# Patient Record
Sex: Male | Born: 1980 | Race: White | Hispanic: No | Marital: Married | Smoking: Current every day smoker
Health system: Southern US, Community
[De-identification: ages and names within clinical notes are randomized; demographics above are authoritative.]

---

## 2001-06-09 ENCOUNTER — Inpatient Hospital Stay (HOSPITAL_COMMUNITY): Admission: EM | Admit: 2001-06-09 | Discharge: 2001-06-11 | Payer: Self-pay

## 2001-08-07 ENCOUNTER — Encounter: Payer: Self-pay | Admitting: Neurosurgery

## 2001-08-07 ENCOUNTER — Encounter: Admission: RE | Admit: 2001-08-07 | Discharge: 2001-08-07 | Payer: Self-pay | Admitting: Neurosurgery

## 2002-08-17 ENCOUNTER — Emergency Department (HOSPITAL_COMMUNITY): Admission: EM | Admit: 2002-08-17 | Discharge: 2002-08-17 | Payer: Self-pay | Admitting: Emergency Medicine

## 2003-10-30 ENCOUNTER — Emergency Department: Payer: Self-pay | Admitting: Emergency Medicine

## 2004-06-04 ENCOUNTER — Emergency Department (HOSPITAL_COMMUNITY): Admission: EM | Admit: 2004-06-04 | Discharge: 2004-06-04 | Payer: Self-pay | Admitting: Emergency Medicine

## 2005-08-16 ENCOUNTER — Emergency Department (HOSPITAL_COMMUNITY): Admission: EM | Admit: 2005-08-16 | Discharge: 2005-08-17 | Payer: Self-pay | Admitting: Emergency Medicine

## 2007-02-20 ENCOUNTER — Emergency Department (HOSPITAL_COMMUNITY): Admission: EM | Admit: 2007-02-20 | Discharge: 2007-02-20 | Payer: Self-pay | Admitting: Emergency Medicine

## 2009-12-01 ENCOUNTER — Emergency Department (HOSPITAL_COMMUNITY): Admission: EM | Admit: 2009-12-01 | Discharge: 2009-12-01 | Payer: Self-pay | Admitting: Emergency Medicine

## 2010-06-05 NOTE — Discharge Summary (Signed)
[place]. [facility]  Patient:    Luis Anderson, Luis Anderson Visit Number: [id number] MRN: [id number]          Service Type: TRA Location: 5000 5013 01 Attending Physician:  Trauma, Md Dictated by:   Eugenia Pancoast, P.A. Admit Date:  06/09/2001 Discharge Date: 06/11/2001   CC:         Carolan Shiver, M.D.  Tanya Nones. Jeral Fruit, M.D.   Discharge Summary  DATE OF BIRTH: 01-06-1981.  CONSULTING PHYSICIANS: Tanya Nones. Jeral Fruit, M.D., Carolan Shiver, M.D.  FINAL DIAGNOSES 1. Fall. 2. Head trauma. 3. Temporal bone fracture. 4. Cerebrospinal fluid leak, left ear. 5. Diplopia. 6. Left orbital wall fracture. 7. Left zygomatic arch fracture. 8. Pneumocephalus, left middle cranial fossa. 9. Subdural hematoma, small in the parietal region.  HISTORY: The patient is a 30 year old gentleman who got out of the car on wet concrete on the street and slipped and fell and hit his head. He noted some bleeding and fluid in his left ear. He subsequently came to the emergency room by private vehicle.  The patient was seen in the emergency room by Dr. Lindie Spruce and Dr. Jeral Fruit.  Dr. Jeral Fruit was consulted secondary to the head injury as noted. The patient underwent a CT scan which revealed the left temporal bone fracture and a fracture of the lateral wall of the left sphenoid sinus and the apex of the left orbit. There was also pneumocephalus in the left middle cranial fossa and a 21 mm subdural hematoma in the left parietal region and the zygomatic arch fracture as also noted. The patients CT scan of the cervical spine was negative. The patient was subsequently hospitalized.  HOSPITAL COURSE: The patient was hospitalized and no untoward events occurred during his hospital stay. He did have a complaint of nausea and vomiting earlier on admission. This did resolve rapidly. He continued to progress in a satisfactory manner during his stay. His diet was advanced to a regular diet as  tolerated. He was also seen by Dr. Dorma Russell. Dr. Dorma Russell saw the patient and noted the fractures of the left zygomatic arch, however, the fracture at this time did not need repair. He did note the patient did have diplopia and this will be followed up as an outpatient.  The patient was doing satisfactorily by Jun 11, 2001. At this time he was up and ambulating without difficulty. He was tolerating his diet satisfactorily and with no significant complaints. He does have occasional headache which is understandable at this time. He was subsequently prepared for discharge.  DISCHARGE MEDICATIONS: He was given Darvocet 1 to 2 p.o. q.4-6h. p.r.n. pain. He was told to take Motrin as needed over-the-counter for this also.  FOLLOW UP: He is given Dr. Eino Farber number to call and is to follow up with Dr. Jeral Fruit in one week.  He was also given Dr. Jac Canavan number and was told to to follow up with Dr. Dorma Russell in approximately one week. He was told to come to the trauma clinic on June 20, 2001, which is Tuesday for follow up with [place].  DISPOSITION:  The patient is subsequently discharged to home in satisfactory and in stable condition on Jun 11, 2001. Dictated by:   Eugenia Pancoast, P.A. Attending Physician:  Trauma, Md DD:  06/11/01 TD:  06/13/01 Job: [id number]

## 2010-06-05 NOTE — Consult Note (Signed)
[place]. [facility]  Patient:    Luis Anderson, Luis Anderson Visit Number: [id number] MRN: [id number]          Service Type: TRA Location: 5000 5013 01 Attending Physician:  Trauma, Md Dictated by:   Carolan Shiver, M.D. Proc. Date: 06/10/01 Admit Date:  06/09/2001 Discharge Date: 06/11/2001   CC:         Tanya Nones. Jeral Fruit, M.D.  Jimmye Norman, M.D.   Consultation Report  REASON FOR CONSULTATION:  Left longitudinal temporal bone fracture with CSF otorrhea and multiple skull base fractures.  I was asked to see the patient by the emergency room physician and the trauma team.  HISTORY OF PRESENT ILLNESS:  Luis Anderson is a 30 year old [place] male who allegedly slipped and fell while getting out of his motor vehicle on Jun 09, 2001.  He slipped in his driveway, fell backwards, and struck his head on concrete.  He denies any loss of consciousness.  He had some slight dizziness and currently denies any hearing loss.  He complains of some horizontal diplopia on left lateral gaze and some slight diplopia with straight ahead vision.  He had positive frank CSF otorrhea from his left external auditory canal in the emergency room and today.  CT scan of his head demonstrated left lateral longitudinal temporal bone fracture which also involved his left lateral orbital wall, the left inferior lateral aspect of the left sphenoid sinus, and a nondisplaced fracture of his left zygomatic arch.  He denied any previous history of skull fractures.  PHYSICAL EXAMINATION:  GENERAL:  He was awake, alert, and oriented x e.  VITAL SIGNS:  He was afebrile.  HEENT:  He did not complain of headache.  His main complaint was that of diplopia.  His head was normocephalic with bilateral symmetric facial motions. There are no signs of any facial paresis or paralysis.  He also had no obvious scalp lacerations.  Eyes: Pupils PERRLA.  There are no signs of high ______ or subjunctival  hemorrhage.  He did have diplopia, mostly on the left lateral gaze and slight diplopia on straight ahead gaze.  On right lateral gaze, he demonstrated lateral gaze nystagmus.  There was no nystagmus in any other field of gaze.  He had a tender left zygoma and temporal area.  Ears: External ears and canals were stable.  There was negative Battle signs AU.  He had a left hemotympanum with a tear of left anterior superior quadrant of the tympanic membrane.  There was CSF medial to the left tympanic membrane.  I did not see any frank CSF leaking today through the TM which seems to have sealed.  Right tympanic membrane was clear and mobile.  There was no narrowing of the left external auditory canal.  External and internal nasal exam was negative, no signs of CSF, rhinorrhea.  Oral cavity: Lips and palate normal.  Teeth intact.  Mandible stable.  NECK:  Negative.  DIAGNOSTIC DATA:  Review of CT head scans show 1) left longitudinal temporal bone fracture, 2) fracture of lateral wall sphenoid sinus in the left lateral orbital wall at the apex, 3) nondisplaced fracture of the left zygomatic arch, 4) middle fossa pneumocephalus with small 1 mm subdural hemorrhage in the left parietal area.  This was new on the Jun 10, 2001, CT scan.  IMPRESSION: 1. Left lateral longitudinal temporal bone fractures with left CSF otorrhea. 2. Fractured left lateral orbital wall, lateral sphenoid sinus wall with    diplopia.  3. New middle fossa pneumocephalus and small subdural hemorrhage, left    parietal area. 4. Nondisplaced fracture of the left zygomatic arch.  RECOMMENDATIONS: 1. Observation at this time.  The CSF leak will probably stop spontaneously.    If it does not, he may need to have either a neurosurgical or neurologic    procedure to seal the leak. 2. Evaluation in my office once the patient is discharged for complete    otomicroscopy and complete audiometric testing. 3. Observe for possible  delayed facial paresis or paralysis. 4. Observe for neurologic changes secondary to subdural hemorrhage. 5. Diplopia is probably secondary to the left lateral orbital wall fracture,    possibly the fracture of the left lateral sphenoid sinus.  There is no    evidence of third or sixth nerve paresis or palsy. 6. The left zygomatic arch does not require treatment at this time as it    is nondisplaced. 7. The patient should have high resolution CT scanning of his temporal    bones prior to discharge. 8. The patient should be observed for any delayed tears of his major vessels    such as intimal tear of his internal carotid artery leading to carotid    cavernous fistula.  He may need to be restudied with MRI and MRA within    the next three to six months. Dictated by:   Carolan Shiver, M.D. Attending Physician:  Trauma, Md DD:  06/10/01 TD:  06/13/01 Job: [id number]

## 2010-06-05 NOTE — Consult Note (Signed)
[place]. [facility]  Patient:    Luis Anderson, Luis Anderson Visit Number: [id number] MRN: [id number]          Service Type: TRA Location: 5000 5013 01 Attending Physician:  Trauma, Md Dictated by:   Tanya Nones. Jeral Fruit, M.D. Proc. Date: 06/09/01 Admit Date:  06/09/2001 Discharge Date: 06/11/2001                            Consultation Report  HISTORY:  The patient is a gentleman who today while getting out of his car he fell and hit his head.  He was brought to the emergency room where he was seen by the emergency room physician.  CT scan of the head __________ was done and we were called for evaluation.  According to the patient, he did not lose consciousness.  He noticed some bloody drainage coming from the left ear as he had been trying to clean the ear with his fingers.  At the present time mentally he is alert and oriented x3.  The pupils are equal and reactive 20 mm and funduscopy is clear.  He has full ocular _________ .  There is a swelling in the left zygoma with quite a bit of tenderness in that area.  There is some redness of the ear and there is obvious bloody fluid which probably is some CSF with blood coming from the left ear.  There is quite a bit of tenderness in the mastoid area.  The hearing seems to be decreased on the left side and no longer on the right side.  Strength is normal in the upper and lower extremities.  Flexion normal.  The CT scan of the head showed that he has a fracture of the temporal bone which goes into the temporomandibular joint. There is also some air in the middle fossa.  There is fracture of the zygoma and fracture of the lateral wall of the orbit.  The patient fractured the base of the skull with a facial fracture.  RECOMMENDATIONS:  The patient is going to be seen by either by the trauma team or by the facial surgeon on call.  From the neurosurgical point of view, there is nothing else to do except observation.  I  basically was advised to avoid cleaning his ear.  We are going to put a loose 4 x 4 to cover the area.  The patient needs to be admitted for observation for at least 24-48 hours until he has stopped draining completely from the left ear.  Any other recommendation will be up to the trauma or maxillofacial surgeon. Dictated by:   Tanya Nones. Jeral Fruit, M.D. Attending Physician:  Trauma, Md DD:  06/09/01 TD:  06/13/01 Job: [id number]

## 2014-10-09 ENCOUNTER — Emergency Department (HOSPITAL_COMMUNITY)
Admission: EM | Admit: 2014-10-09 | Discharge: 2014-10-09 | Disposition: A | Discharge status: Home / Self Care | Payer: Self-pay | Attending: Emergency Medicine | Admitting: Emergency Medicine

## 2014-10-09 ENCOUNTER — Encounter (HOSPITAL_COMMUNITY): Payer: Self-pay | Admitting: Family Medicine

## 2014-10-09 ENCOUNTER — Emergency Department (HOSPITAL_COMMUNITY): Payer: Self-pay

## 2014-10-09 DIAGNOSIS — R140 Abdominal distension (gaseous): Secondary | ICD-10-CM | POA: Insufficient documentation

## 2014-10-09 DIAGNOSIS — K5900 Constipation, unspecified: Secondary | ICD-10-CM | POA: Insufficient documentation

## 2014-10-09 DIAGNOSIS — Z720 Tobacco use: Secondary | ICD-10-CM | POA: Insufficient documentation

## 2014-10-09 DIAGNOSIS — R200 Anesthesia of skin: Secondary | ICD-10-CM | POA: Insufficient documentation

## 2014-10-09 DIAGNOSIS — R0789 Other chest pain: Secondary | ICD-10-CM | POA: Insufficient documentation

## 2014-10-09 DIAGNOSIS — F419 Anxiety disorder, unspecified: Secondary | ICD-10-CM | POA: Insufficient documentation

## 2014-10-09 DIAGNOSIS — R202 Paresthesia of skin: Secondary | ICD-10-CM | POA: Insufficient documentation

## 2014-10-09 DIAGNOSIS — R0602 Shortness of breath: Secondary | ICD-10-CM | POA: Insufficient documentation

## 2014-10-09 LAB — BASIC METABOLIC PANEL
Anion gap: 10 (ref 5–15)
BUN: 10 mg/dL (ref 6–20)
CALCIUM: 9.6 mg/dL (ref 8.9–10.3)
CO2: 26 mmol/L (ref 22–32)
Chloride: 101 mmol/L (ref 101–111)
Creatinine, Ser: 0.99 mg/dL (ref 0.61–1.24)
GFR calc Af Amer: >60 mL/min (ref >60)
GLUCOSE: 121 mg/dL — AB (ref 65–99)
POTASSIUM: 3.6 mmol/L (ref 3.5–5.1)
Sodium: 137 mmol/L (ref 135–145)

## 2014-10-09 LAB — CBC
HEMATOCRIT: 46.6 % (ref 39.0–52.0)
Hemoglobin: 16 g/dL (ref 13.0–17.0)
MCH: 29.5 pg (ref 26.0–34.0)
MCHC: 34.3 g/dL (ref 30.0–36.0)
MCV: 85.8 fL (ref 78.0–100.0)
Platelets: 181 10*3/uL (ref 150–400)
RBC: 5.43 MIL/uL (ref 4.22–5.81)
RDW: 13.6 % (ref 11.5–15.5)
WBC: 6.4 10*3/uL (ref 4.0–10.5)

## 2014-10-09 LAB — I-STAT TROPONIN, ED: Troponin i, poc: 0 ng/mL (ref 0.00–0.08)

## 2014-10-09 NOTE — Discharge Instructions (Signed)
See primary care doctor for appointment. Consider stress test since you have persistent symptoms.   Return to ER if you have worse chest pain, shortness of breath, trouble breathing, worse numbness.

## 2014-10-09 NOTE — ED Notes (Signed)
Pt sts increasing SOB over the past month, sts worsening over the past 2 weeks and stomach bloating, hands feel cold, chest pain and numbness in face. sts CHF runs in his family. St she took his BP at home and was hypertensive at home.

## 2014-10-09 NOTE — ED Notes (Signed)
MD at bedside. 

## 2014-10-09 NOTE — ED Provider Notes (Signed)
CSN: [id number]     Arrival date & time 10/09/14  1304 History   First MD Initiated Contact with Patient 10/09/14 1543     Chief Complaint  Patient presents with  . Shortness of Breath  . Dizziness  . Chest Pain     (Consider location/radiation/quality/duration/timing/severity/associated sxs/prior Treatment) The history is provided by the patient.  Luis Anderson is a 34 y.o. male who presented with shortness of breath, chest pain, numbness and tingling. Patient has been having shortness of breath over the last month as well as intermittent chest pain. Went to [facility] 2 weeks ago and had negative troponin, normal labs, negative d-dimer. He does not have any primary care doctor. He was at work today and then had another episode of shortness of breath and some chest pain as well as his hands feel cold. Denies any weakness. States that he has been having some constipation and increased gas and abdominal bloating. Denies any fever or vomiting. He is otherwise healthy and no history of CAD. Father has CHF. He smokes occasionally.    History reviewed. No pertinent past medical history. History reviewed. No pertinent past surgical history. History reviewed. No pertinent family history. Social History  Substance Use Topics  . Smoking status: Current Every Day Smoker    Types: Cigarettes  . Smokeless tobacco: None  . Alcohol Use: No    Review of Systems  Respiratory: Positive for shortness of breath.   Cardiovascular: Positive for chest pain.  All other systems reviewed and are negative.     Allergies  Review of patient's allergies indicates no known allergies.  Home Medications   Prior to Admission medications   Not on File   BP 146/80 mmHg  Pulse 67  Temp(Src) 97.8 F (36.6 C)  Resp 18  SpO2 100% Physical Exam  Constitutional: He is oriented to person, place, and time.  Anxious   HENT:  Head: Normocephalic.  Mouth/Throat: Oropharynx is clear and moist.   Eyes: Conjunctivae are normal. Pupils are equal, round, and reactive to light.  Neck: Normal range of motion. Neck supple.  Cardiovascular: Normal rate, regular rhythm and normal heart sounds.   Pulmonary/Chest: Effort normal and breath sounds normal. No respiratory distress. He has no wheezes. He has no rales.  Abdominal: Soft. Bowel sounds are normal. He exhibits no distension. There is no tenderness. There is no rebound and no guarding.  Musculoskeletal: Normal range of motion. He exhibits no edema or tenderness.  Neurological: He is alert and oriented to person, place, and time. No cranial nerve deficit. Coordination normal.  Skin: Skin is warm and dry.  Psychiatric: He has a normal mood and affect. His behavior is normal. Judgment and thought content normal.  Nursing note and vitals reviewed.   ED Course  Procedures (including critical care time) Labs Review Labs Reviewed  BASIC METABOLIC PANEL - Abnormal; Notable for the following:    Glucose, Bld 121 (*)    All other components within normal limits  CBC  I-STAT TROPOININ, ED    Imaging Review Dg Chest 2 View  10/09/2014   CLINICAL DATA:  Increasing shortness of breath over the last month abdominal bloating. Initial encounter.  EXAM: CHEST  2 VIEW  COMPARISON:  09/14/2014 radiographs.  FINDINGS: The heart size and mediastinal contours are normal. The lungs are clear. There is no pleural effusion or pneumothorax. No acute osseous findings are identified.  IMPRESSION: Stable chest.  No active cardiopulmonary process.   Electronically Signed  By: Carey Bullocks M.D.   On: 10/09/2014 13:42   I have personally reviewed and evaluated these images and lab results as part of my medical decision-making.   EKG Interpretation   Date/Time:  Wednesday October 09 2014 13:07:08 EDT Ventricular Rate:  67 PR Interval:  136 QRS Duration: 90 QT Interval:  374 QTC Calculation: 395 R Axis:   82 Text Interpretation:  Normal sinus rhythm  Normal ECG No previous ECGs  available Confirmed by YAO  MD, DAVID ([id number]) on 10/09/2014 3:47:07 PM      MDM   Final diagnoses:  None    Luis Anderson is a 34 y.o. male here with shortness of breath, chest pain. Symptoms over the last month, worse today. Appears anxious. No previous stress test. D-dimer neg 2 weeks ago at [facility]. I think likely more anxiety related. [facility].   4:33 PM Labs unremarkable. CXR nl. Able to get him outpatient f/u. Will dc home. Pain free now.     Richardean Canal, MD 10/09/14 [phone]

## 2014-10-17 ENCOUNTER — Encounter: Payer: Self-pay | Admitting: Family Medicine

## 2014-10-17 ENCOUNTER — Ambulatory Visit (INDEPENDENT_AMBULATORY_CARE_PROVIDER_SITE_OTHER): Payer: Self-pay | Admitting: Family Medicine

## 2014-10-17 VITALS — BP 124/74 | HR 62 | Temp 98.1°F | Resp 16 | Ht 71.0 in | Wt 190.0 lb

## 2014-10-17 DIAGNOSIS — Z7189 Other specified counseling: Secondary | ICD-10-CM

## 2014-10-17 DIAGNOSIS — K047 Periapical abscess without sinus: Secondary | ICD-10-CM

## 2014-10-17 DIAGNOSIS — Z23 Encounter for immunization: Secondary | ICD-10-CM

## 2014-10-17 DIAGNOSIS — Z7689 Persons encountering health services in other specified circumstances: Secondary | ICD-10-CM

## 2014-10-17 DIAGNOSIS — F411 Generalized anxiety disorder: Secondary | ICD-10-CM

## 2014-10-17 MED ORDER — ESOMEPRAZOLE MAGNESIUM 40 MG PO CPDR: 40.0000 mg | DELAYED_RELEASE_CAPSULE | Freq: Every day | ORAL | Status: AC

## 2014-10-17 MED ORDER — OMEPRAZOLE 40 MG PO CPDR: 40.0000 mg | DELAYED_RELEASE_CAPSULE | Freq: Every day | ORAL | Status: AC

## 2014-10-17 MED ORDER — PENICILLIN V POTASSIUM 250 MG PO TABS: 250.0000 mg | ORAL_TABLET | Freq: Four times a day (QID) | ORAL | Status: DC

## 2014-10-17 NOTE — Patient Instructions (Signed)
Take penicillin 4 times a day, every 6 hours. You need to see a dentist as soon as feasible. Take Prilosec once a day. You are getting a Tetanus booster day. Consider getting a flu shoot. Come back in one year and as needed. If you can get an orange card, we can refer you to the dental clinic. Health Maintenance A healthy lifestyle and preventative care can promote health and wellness.  Maintain regular health, dental, and eye exams.  Eat a healthy diet. Foods like vegetables, fruits, whole grains, low-fat dairy products, and lean protein foods contain the nutrients you need and are low in calories. Decrease your intake of foods high in solid fats, added sugars, and salt. Get information about a proper diet from your health care provider, if necessary.  Regular physical exercise is one of the most important things you can do for your health. Most adults should get at least 150 minutes of moderate-intensity exercise (any activity that increases your heart rate and causes you to sweat) each week. In addition, most adults need muscle-strengthening exercises on 2 or more days a week.   Maintain a healthy weight. The body mass index (BMI) is a screening tool to identify possible weight problems. It provides an estimate of body fat based on height and weight. Your health care provider can find your BMI and can help you achieve or maintain a healthy weight. For males 20 years and older:  A BMI below 18.5 is considered underweight.  A BMI of 18.5 to 24.9 is normal.  A BMI of 25 to 29.9 is considered overweight.  A BMI of 30 and above is considered obese.  Maintain normal blood lipids and cholesterol by exercising and minimizing your intake of saturated fat. Eat a balanced diet with plenty of fruits and vegetables. Blood tests for lipids and cholesterol should begin at age 68 and be repeated every 5 years. If your lipid or cholesterol levels are high, you are over age 61, or you are at high risk for  heart disease, you may need your cholesterol levels checked more frequently.Ongoing high lipid and cholesterol levels should be treated with medicines if diet and exercise are not working.  If you smoke, find out from your health care provider how to quit. If you do not use tobacco, do not start.  Lung cancer screening is recommended for adults aged 55-80 years who are at high risk for developing lung cancer because of a history of smoking. A yearly low-dose CT scan of the lungs is recommended for people who have at least a 30-pack-year history of smoking and are current smokers or have quit within the past 15 years. A pack year of smoking is smoking an average of 1 pack of cigarettes a day for 1 year (for example, a 30-pack-year history of smoking could mean smoking 1 pack a day for 30 years or 2 packs a day for 15 years). Yearly screening should continue until the smoker has stopped smoking for at least 15 years. Yearly screening should be stopped for people who develop a health problem that would prevent them from having lung cancer treatment.  If you choose to drink alcohol, do not have more than 2 drinks per day. One drink is considered to be 12 oz (360 mL) of beer, 5 oz (150 mL) of wine, or 1.5 oz (45 mL) of liquor.  Avoid the use of street drugs. Do not share needles with anyone. Ask for help if you need support or instructions  about stopping the use of drugs.  High blood pressure causes heart disease and increases the risk of stroke. Blood pressure should be checked at least every 1-2 years. Ongoing high blood pressure should be treated with medicines if weight loss and exercise are not effective.  If you are 79-22 years old, ask your health care provider if you should take aspirin to prevent heart disease.  Diabetes screening involves taking a blood sample to check your fasting blood sugar level. This should be done once every 3 years after age 73 if you are at a normal weight and without  risk factors for diabetes. Testing should be considered at a younger age or be carried out more frequently if you are overweight and have at least 1 risk factor for diabetes.  Colorectal cancer can be detected and often prevented. Most routine colorectal cancer screening begins at the age of 69 and continues through age 45. However, your health care provider may recommend screening at an earlier age if you have risk factors for colon cancer. On a yearly basis, your health care provider may provide home test kits to check for hidden blood in the stool. A small camera at the end of a tube may be used to directly examine the colon (sigmoidoscopy or colonoscopy) to detect the earliest forms of colorectal cancer. Talk to your health care provider about this at age 55 when routine screening begins. A direct exam of the colon should be repeated every 5-10 years through age 67, unless early forms of precancerous polyps or small growths are found.  People who are at an increased risk for hepatitis B should be screened for this virus. You are considered at high risk for hepatitis B if:  You were born in a country where hepatitis B occurs often. Talk with your health care provider about which countries are considered high risk.  Your parents were born in a high-risk country and you have not received a shot to protect against hepatitis B (hepatitis B vaccine).  You have HIV or AIDS.  You use needles to inject street drugs.  You live with, or have sex with, someone who has hepatitis B.  You are a man who has sex with other men (MSM).  You get hemodialysis treatment.  You take certain medicines for conditions like cancer, organ transplantation, and autoimmune conditions.  Hepatitis C blood testing is recommended for all people born from [birth year] through 1964 and any individual with known risk factors for hepatitis C.  Healthy men should no longer receive prostate-specific antigen (PSA) blood tests as part of  routine cancer screening. Talk to your health care provider about prostate cancer screening.  Testicular cancer screening is not recommended for adolescents or adult males who have no symptoms. Screening includes self-exam, a health care provider exam, and other screening tests. Consult with your health care provider about any symptoms you have or any concerns you have about testicular cancer.  Practice safe sex. Use condoms and avoid high-risk sexual practices to reduce the spread of sexually transmitted infections (STIs).  You should be screened for STIs, including gonorrhea and chlamydia if:  You are sexually active and are younger than 24 years.  You are older than 24 years, and your health care provider tells you that you are at risk for this type of infection.  Your sexual activity has changed since you were last screened, and you are at an increased risk for chlamydia or gonorrhea. Ask your health care provider if you  are at risk.  If you are at risk of being infected with HIV, it is recommended that you take a prescription medicine daily to prevent HIV infection. This is called pre-exposure prophylaxis (PrEP). You are considered at risk if:  You are a man who has sex with other men (MSM).  You are a heterosexual man who is sexually active with multiple partners.  You take drugs by injection.  You are sexually active with a partner who has HIV.  Talk with your health care provider about whether you are at high risk of being infected with HIV. If you choose to begin PrEP, you should first be tested for HIV. You should then be tested every 3 months for as long as you are taking PrEP.  Use sunscreen. Apply sunscreen liberally and repeatedly throughout the day. You should seek shade when your shadow is shorter than you. Protect yourself by wearing long sleeves, pants, a wide-brimmed hat, and sunglasses year round whenever you are outdoors.  Tell your health care provider of new moles  or changes in moles, especially if there is a change in shape or color. Also, tell your health care provider if a mole is larger than the size of a pencil eraser.  A one-time screening for abdominal aortic aneurysm (AAA) and surgical repair of large AAAs by ultrasound is recommended for men aged 76-75 years who are current or former smokers.  Stay current with your vaccines (immunizations). Document Released: 07/03/2007 Document Revised: 01/09/2013 Document Reviewed: 06/01/2010 [facility] Patient Information 2015 [place], [place]. This information is not intended to replace advice given to you by your health care provider. Make sure you discuss any questions you have with your health care provider.

## 2014-10-17 NOTE — Progress Notes (Signed)
Patient ID: Luis Anderson, male   DOB: 04-Mar-1980, 34 y.o.   MRN: [id number]   Luis Anderson, is a 34 y.o. male  WUJ:[id number]  [id number]  DOB - 1981/01/07  CC:  Chief Complaint  Patient presents with  . Establish Care    follow up from ER for shortness of breath        HPI: Luis Anderson is a 35 y.o. male here to establish care. He is here for follow-up ED visit for shortness of breath and chest pain. A cardiac origin has been elimiated. He admits to be under a lot of stress and understands that his symptoms may be related to anxiety. He is in the process of smoking cessation. Quit a couple of weeks ago. His major complaint today is a dental infection.   Hospital note. The history is provided by the patient.  Luis Anderson is a 34 y.o. male who presented with shortness of breath, chest pain, numbness and tingling. Patient has been having shortness of breath over the last month as well as intermittent chest pain. Went to [facility] 2 weeks ago and had negative troponin, normal labs, negative d-dimer. He does not have any primary care doctor. He was at work today and then had another episode of shortness of breath and some chest pain as well as his hands feel cold. Denies any weakness. States that he has been having some constipation and increased gas and abdominal bloating. Denies any fever or vomiting. He is otherwise healthy and no history of CAD. Father has CHF. He smokes occasionally.    No Known Allergies History reviewed. No pertinent past medical history. No current outpatient prescriptions on file prior to visit.   No current facility-administered medications on file prior to visit.   Family History  Problem Relation Age of Onset  . Heart disease Father   . Hypertension Father    Social History   Social History  . Marital Status: Married    Spouse Name: N/A  . Number of Children: N/A  . Years of Education: N/A   Occupational History  . Not on file.    Social History Main Topics  . Smoking status: Current Every Day Smoker    Types: Cigarettes  . Smokeless tobacco: Not on file  . Alcohol Use: No  . Drug Use: No  . Sexual Activity: Not on file   Other Topics Concern  . Not on file   Social History Narrative    Review of Systems: Constitutional: Negative for fever, chills.Positive for fatigue, some appetite and weight loss Skin: Negative for rashes or lesions of concern. HENT: Negative for ear pain, ear discharge.nose bleeds. Tooth and gum pain Eyes: Negative for pain, discharge, redness, itching. Complains of some touble with focusing Neck: Negative for pain, stiffness Respiratory: Negative for cough, shortness of breath,  Positive for awareness of breathing. (smoker)  Cardiovascular: Positive  for chest pain, palpitations (resolved) and leg swelling. Gastrointestinal: Negative for abdominal pain, nausea, vomiting, diarrhea  Positive for some constipations. positive for heartburn (taking OTC Prilosec.) Genitourinary: Negative for dysuria, urgency, frequency, hematuria,  Musculoskeletal: Negative for back pain, joint pain, joint  swelling, and gait problem.Negative for weakness.Positive for pain in thighs intermittenly Neurological: Negative for dizziness, tremors, seizures, syncope,   light-headedness, numbness and headaches.  Hematological: Negative for easy bruising or bleeding Psychiatric/Behavioral: Negative for depression, anxiety, Positive for decreased concentration.    Objective:   Filed Vitals:   10/17/14 1305  BP: 124/74  Pulse:  62  Temp: 98.1 F (36.7 C)  Resp: 16    Physical Exam: Constitutional: Patient appears well-developed and well-nourished. No distress. HENT: Normocephalic, atraumatic, External right and left ear normal. Oropharynx is clear and moist. There is redness and tenderness on the upper left gum and around tooth Eyes: Conjunctivae and EOM are normal. PERRLA, no scleral icterus. Neck: Normal  ROM. Neck supple. No lymphadenopathy, No thyromegaly. CVS: RRR, S1/S2 +, no murmurs, no gallops, no rubs Pulmonary: Effort and breath sounds normal, no stridor, rhonchi, wheezes, rales.  Abdominal: Soft. Normoactive BS,, no distension, tenderness, rebound or guarding.  Musculoskeletal: Normal range of motion. No edema and no tenderness.  Neuro: Alert.Normal muscle tone coordination. Non-focal Skin: Skin is warm and dry. No rash noted. Not diaphoretic. No erythema. No pallor. Psychiatric: Normal mood and affect. Behavior, judgment, thought content normal.  Lab Results  Component Value Date   WBC 6.4 10/09/2014   HGB 16.0 10/09/2014   HCT 46.6 10/09/2014   MCV 85.8 10/09/2014   PLT 181 10/09/2014   Lab Results  Component Value Date   CREATININE 0.99 10/09/2014   BUN 10 10/09/2014   NA 137 10/09/2014   K 3.6 10/09/2014   CL 101 10/09/2014   CO2 26 10/09/2014    No results found for: HGBA1C Lipid Panel  No results found for: CHOL, TRIG, HDL, CHOLHDL, VLDL, LDLCALC     Assessment and plan:   1. Encounter to establish care - I have reviewed information provided by the patient and have review hospital record. -He has had recent bloodwork so will not recheck at this time   2. Need for prophylactic vaccination and inoculation against influenza -Patient declines.  3. Need for Tdap vaccination -Tdap given today.  4. Dental infection -Penicillin 250 mg #40, one po qid for 10 days -Recommend follow-up with dentist  5. Anxiety -have recommended follow-up with mental health  6. Health maintenance -Handout and review of healthly lifestyle -Have discussed his smoking status and congratulated him on his current success  Follow-up prn.  The patient was given clear instructions to go to ER or return to medical center if symptoms don't improve, worsen or new problems develop. The patient verbalized understanding.    Henrietta Hoover FNP  10/17/2014, 1:44 PM

## 2014-10-18 DIAGNOSIS — F411 Generalized anxiety disorder: Secondary | ICD-10-CM | POA: Insufficient documentation

## 2014-10-18 DIAGNOSIS — K047 Periapical abscess without sinus: Secondary | ICD-10-CM | POA: Insufficient documentation

## 2014-10-18 DIAGNOSIS — Z7689 Persons encountering health services in other specified circumstances: Secondary | ICD-10-CM | POA: Insufficient documentation

## 2016-11-18 IMAGING — CR DG CHEST 2V
2 series · 2 of 2 positions shown · non-contrast
Comparison: 09/14/2014 radiographs.

CLINICAL DATA: Increasing shortness of breath over the last month
abdominal bloating. Initial encounter.

EXAM:
CHEST  2 VIEW

[chest pa]
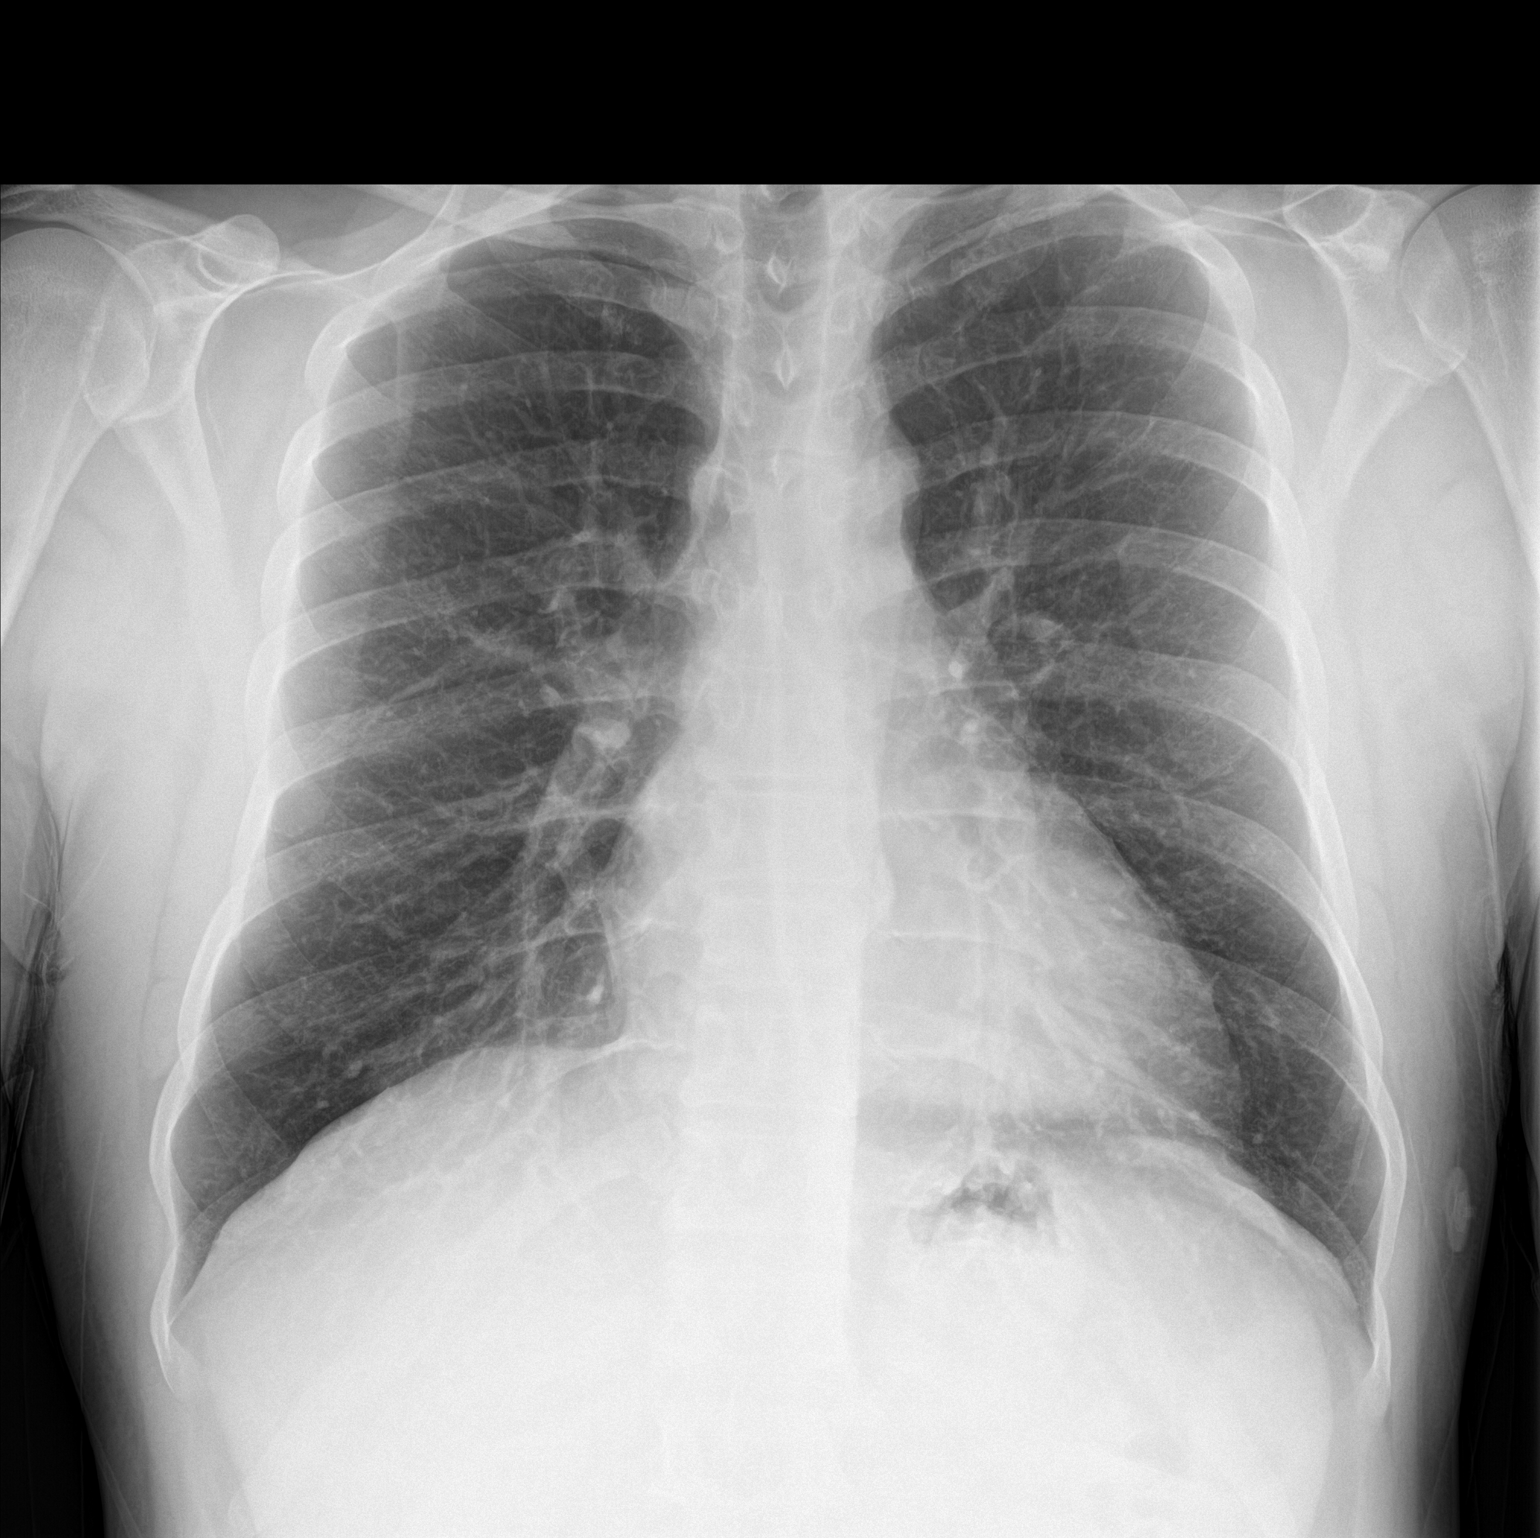

[chest lat]
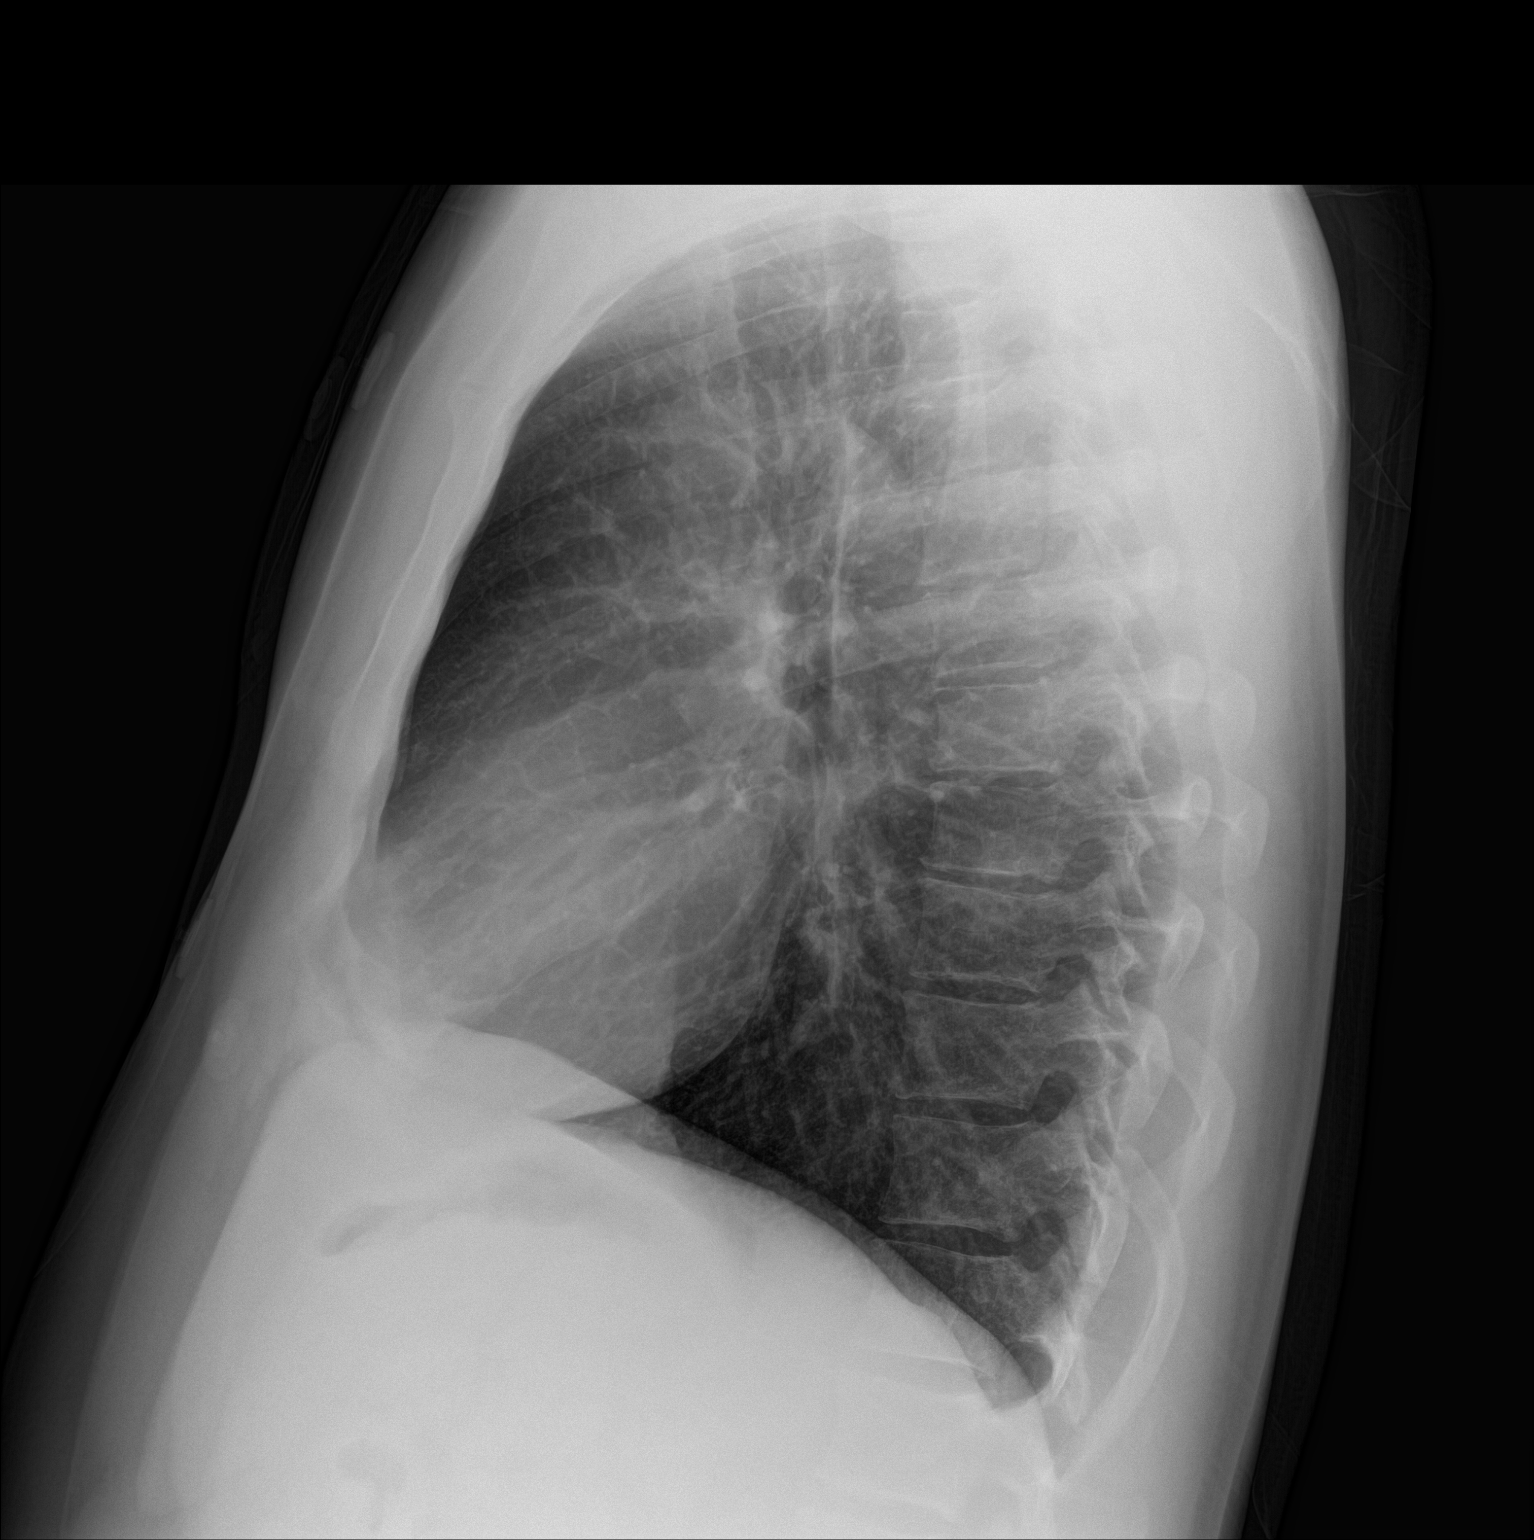

[2 of 2 positions shown; findings below may reference images not displayed]

FINDINGS: The heart size and mediastinal contours are normal. The lungs are
clear. There is no pleural effusion or pneumothorax. No acute
osseous findings are identified.
IMPRESSION: Stable chest.  No active cardiopulmonary process.

## 2021-07-17 ENCOUNTER — Other Ambulatory Visit (HOSPITAL_BASED_OUTPATIENT_CLINIC_OR_DEPARTMENT_OTHER): Payer: Self-pay

## 2021-07-17 ENCOUNTER — Emergency Department (HOSPITAL_BASED_OUTPATIENT_CLINIC_OR_DEPARTMENT_OTHER)
Admission: EM | Admit: 2021-07-17 | Discharge: 2021-07-17 | Disposition: A | Discharge status: Home / Self Care | Payer: Medicaid Other | Attending: Emergency Medicine | Admitting: Emergency Medicine

## 2021-07-17 ENCOUNTER — Other Ambulatory Visit: Payer: Self-pay

## 2021-07-17 DIAGNOSIS — I10 Essential (primary) hypertension: Secondary | ICD-10-CM | POA: Diagnosis not present

## 2021-07-17 DIAGNOSIS — K047 Periapical abscess without sinus: Secondary | ICD-10-CM | POA: Insufficient documentation

## 2021-07-17 DIAGNOSIS — K0889 Other specified disorders of teeth and supporting structures: Secondary | ICD-10-CM | POA: Diagnosis present

## 2021-07-17 MED ORDER — AMOXICILLIN 500 MG PO CAPS
500.0000 mg | ORAL_CAPSULE | Freq: Three times a day (TID) | ORAL | 0 refills | Status: AC
  Filled 2021-07-17: qty 21, 7d supply, fill #0

## 2021-07-17 MED ORDER — IBUPROFEN 800 MG PO TABS
800.0000 mg | ORAL_TABLET | Freq: Three times a day (TID) | ORAL | 0 refills | Status: AC
  Filled 2021-07-17: qty 21, 7d supply, fill #0

## 2021-07-17 NOTE — ED Triage Notes (Signed)
Patient arrives with complaints of dental abscess to right lower teeth. Patient is scheduled to see his dentist in 2 weeks, but he would like to get oral antibiotics and the pain under control.

## 2021-07-17 NOTE — ED Provider Notes (Signed)
[facility] EMERGENCY DEPT Provider Note   CSN: [id number] Arrival date & time: 07/17/21  1330     History  Chief Complaint  Patient presents with   Dental Problem    Luis Anderson is a 41 y.o. male.  HPI  Patient presents due to right lower jaw dental pain.  This started 4 days ago, he called his dentist who is not able to see him until 2 weeks from now.  He has an appointment scheduled but is not tolerating the pain.  He tried Tylenol without any improvement.  Denies any fevers, difficulty swallowing, neck stiffness, voice changes, drainage.   Home Medications Prior to Admission medications   Medication Sig Start Date End Date Taking? Authorizing Provider  amoxicillin (AMOXIL) 500 MG capsule Take 1 capsule (500 mg total) by mouth 3 (three) times daily. 07/17/21  Yes Theron Arista, PA-C  ibuprofen (ADVIL) 800 MG tablet Take 1 tablet (800 mg total) by mouth 3 (three) times daily. 07/17/21  Yes Theron Arista, PA-C  esomeprazole (NEXIUM) 40 MG capsule Take 1 capsule (40 mg total) by mouth daily. 10/17/14   Henrietta Hoover, NP  HYDROcodone-acetaminophen (NORCO/VICODIN) 5-325 MG tablet Take 1 tablet by mouth every 6 (six) hours as needed for moderate pain.    [provider]  omeprazole (PRILOSEC) 40 MG capsule Take 1 capsule (40 mg total) by mouth daily. 10/17/14   Henrietta Hoover, NP      Allergies    Patient has no known allergies.    Review of Systems   Review of Systems  Physical Exam Updated Vital Signs BP (!) 145/85 (BP Location: Right Arm)   Pulse 72   Temp 97.6 F (36.4 C)   Resp 18   Ht 5\' 11"  (1.803 m)   Wt 88.5 kg   SpO2 100%   BMI 27.20 kg/m  Physical Exam Vitals and nursing note reviewed. Exam conducted with a chaperone present.  Constitutional:      Appearance: Normal appearance.  HENT:     Head: Normocephalic.     Mouth/Throat:     Mouth: Mucous membranes are moist.     Pharynx: No oropharyngeal exudate or posterior  oropharyngeal erythema.     Comments: No sublingual tenderness or swelling. Uvula is midline. No trismus.  Pain and dental abscess to third tooth from midline right side of lower jaw. Eyes:     Extraocular Movements: Extraocular movements intact.     Pupils: Pupils are equal, round, and reactive to light.  Cardiovascular:     Rate and Rhythm: Normal rate and regular rhythm.  Pulmonary:     Effort: Pulmonary effort is normal.     Breath sounds: Normal breath sounds.  Musculoskeletal:     Cervical back: Normal range of motion. No rigidity or tenderness.  Neurological:     Mental Status: He is alert.  Psychiatric:        Mood and Affect: Mood normal.     ED Results / Procedures / Treatments   Labs (all labs ordered are listed, but only abnormal results are displayed) Labs Reviewed - No data to display  EKG None  Radiology No results found.  Procedures Procedures    Medications Ordered in ED Medications - No data to display  ED Course/ Medical Decision Making/ A&P                           Medical Decision Making Risk Prescription drug  management.   Patient presents with dental pain.  Exam is most consistent with a dental abscess.  No sublingual tenderness or swelling, does not appear consistent with Ludwig angina.  There is no voice changes, fever neck pain or fevers so I do not feel strongly this would be retropharyngeal abscess.  Additionally considered PTA but uvula is midline there is no tonsillar exudate so I think unlikely.   Patient already has follow-up with a dentist in 2 weeks which is reassuring.  He is mildly hypertensive but his vitals are reassuring he is not febrile or tachycardic.  He is handling secretions well, does not appear septic.  We will discharge patient with amoxicillin and ibuprofen, return precautions were discussed and patient discharged in stable condition.        Final Clinical Impression(s) / ED Diagnoses Final diagnoses:   Dental abscess    Rx / DC Orders ED Discharge Orders          Ordered    ibuprofen (ADVIL) 800 MG tablet  3 times daily        07/17/21 1506    amoxicillin (AMOXIL) 500 MG capsule  3 times daily        07/17/21 1506              Theron Arista, PA-C 07/17/21 2145    Benjiman Core, MD 07/21/21 [phone]

## 2021-07-17 NOTE — Discharge Instructions (Addendum)
You have a dental abscess.  Take 500 mg of amoxicillin 3 times daily for 7 days.  Take ibuprofen 800 mg 3 times daily with food and water or every 8 hours.  You can also take Tylenol for breakthrough pain and fevers.  If you start having fevers, worsening swelling, difficulty swallowing

## 2021-07-17 NOTE — ED Notes (Signed)
Patient given discharge instructions, all questions answered. Patient in possession of all belongings, directed to the discharge area
